# Patient Record
Sex: Male | Born: 1955 | Race: Black or African American | Hispanic: No | Marital: Married | Smoking: Current every day smoker
Health system: Southern US, Community
[De-identification: ages and names within clinical notes are randomized; demographics above are authoritative.]

## PROBLEM LIST (undated history)

## (undated) HISTORY — PX: HERNIA REPAIR: SHX51

---

## 2013-01-23 ENCOUNTER — Emergency Department: Payer: Self-pay | Admitting: Emergency Medicine
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
## 2018-03-20 ENCOUNTER — Other Ambulatory Visit: Payer: Self-pay

## 2018-03-20 ENCOUNTER — Encounter: Payer: Self-pay | Admitting: Emergency Medicine

## 2018-03-20 ENCOUNTER — Emergency Department: Payer: Self-pay

## 2018-03-20 ENCOUNTER — Emergency Department
Admission: EM | Admit: 2018-03-20 | Discharge: 2018-03-20 | Disposition: A | Discharge status: Home / Self Care | Payer: Self-pay | Attending: Emergency Medicine | Admitting: Emergency Medicine

## 2018-03-20 DIAGNOSIS — R42 Dizziness and giddiness: Secondary | ICD-10-CM | POA: Insufficient documentation

## 2018-03-20 DIAGNOSIS — I10 Essential (primary) hypertension: Secondary | ICD-10-CM | POA: Insufficient documentation

## 2018-03-20 DIAGNOSIS — F17210 Nicotine dependence, cigarettes, uncomplicated: Secondary | ICD-10-CM | POA: Insufficient documentation

## 2018-03-20 LAB — CBC
HCT: 46.2 % (ref 39.0–52.0)
Hemoglobin: 14.7 g/dL (ref 13.0–17.0)
MCH: 29.1 pg (ref 26.0–34.0)
MCHC: 31.8 g/dL (ref 30.0–36.0)
MCV: 91.3 fL (ref 80.0–100.0)
NRBC: 0 % (ref 0.0–0.2)
Platelets: 204 10*3/uL (ref 150–400)
RBC: 5.06 MIL/uL (ref 4.22–5.81)
RDW: 13 % (ref 11.5–15.5)
WBC: 6.6 10*3/uL (ref 4.0–10.5)

## 2018-03-20 LAB — BASIC METABOLIC PANEL
Anion gap: 7 (ref 5–15)
BUN: 11 mg/dL (ref 8–23)
CO2: 26 mmol/L (ref 22–32)
Calcium: 9.4 mg/dL (ref 8.9–10.3)
Chloride: 105 mmol/L (ref 98–111)
Creatinine, Ser: 0.67 mg/dL (ref 0.61–1.24)
GFR calc non Af Amer: >60 mL/min (ref >60)
Glucose, Bld: 97 mg/dL (ref 70–99)
Potassium: 4.2 mmol/L (ref 3.5–5.1)
Sodium: 138 mmol/L (ref 135–145)

## 2018-03-20 LAB — GLUCOSE, CAPILLARY: Glucose-Capillary: 77 mg/dL (ref 70–99)

## 2018-03-20 LAB — TROPONIN I: Troponin I: <0.03 ng/mL

## 2018-03-20 MED ORDER — HYDROCHLOROTHIAZIDE 25 MG PO TABS: 25.0000 mg | ORAL_TABLET | Freq: Every day | ORAL | 1 refills | Status: AC

## 2018-03-20 MED ORDER — HYDROCHLOROTHIAZIDE 25 MG PO TABS
25.0000 mg | ORAL_TABLET | Freq: Every day | ORAL | Status: DC
  Administered 2018-03-20: 25 mg via ORAL
  Filled 2018-03-20: qty 1

## 2018-03-20 NOTE — ED Provider Notes (Addendum)
[facility] Emergency Department Provider Note  ____________________________________________  Time seen: Approximately 3:40 PM  I have reviewed the triage vital signs and the nursing notes.   HISTORY  Chief Complaint Dizziness   HPI Shawn Heidelbergllen Tuff Jr. is a 62 y.o. male with history of smoking who presents for evaluation of lightheadedness.  Patient reports intermittent episodes of lightheadedness for the last 5 days.  He denies vertigo or room spinning sensation.  He reports that he feels like he is going to pass out.  The symptoms usually happen when he stands up and walks and resolves when he sits down.  He denies headache, slurred speech, facial droop, unilateral weakness or numbness, changes in vision, chest pain, shortness of breath, abdominal pain, nausea, vomiting.  Patient has not seen a doctor for an annual checkup in several years.  He was told 6 years ago after visit to the ER that he had high blood pressure but he never followed up for it.  He denies ever being on any medications for it.  He denies personal or family history of ischemic heart disease or stroke.  PMH None - reviewed  Past Surgical History:  Procedure Laterality Date  . HERNIA REPAIR      Prior to Admission medications   Medication Sig Start Date End Date Taking? Authorizing Provider  hydrochlorothiazide (HYDRODIURIL) 25 MG tablet Take 1 tablet (25 mg total) by mouth daily. 03/20/18   Nita SickleVeronese, [place], MD    Allergies Patient has no known allergies.  FH No history of stroke or heart disease.  Social History Social History   Tobacco Use  . Smoking status: Current Every Day Smoker    Packs/day: 0.50    Types: Cigarettes  . Smokeless tobacco: Never Used  Substance Use Topics  . Alcohol use: Yes    Comment: weekends  . Drug use: Never    Review of Systems  Constitutional: Negative for fever. + lightheadedness Eyes: Negative for visual changes. ENT: Negative for  sore throat. Neck: No neck pain  Cardiovascular: Negative for chest pain. Respiratory: Negative for shortness of breath. Gastrointestinal: Negative for abdominal pain, vomiting or diarrhea. Genitourinary: Negative for dysuria. Musculoskeletal: Negative for back pain. Skin: Negative for rash. Neurological: Negative for headaches, weakness or numbness. Psych: No SI or HI  ____________________________________________   PHYSICAL EXAM:  VITAL SIGNS: ED Triage Vitals  Enc Vitals Group     BP 03/20/18 1219 (!) 201/79     Pulse Rate 03/20/18 1219 73     Resp 03/20/18 1219 16     Temp 03/20/18 1219 98.3 F (36.8 C)     Temp Source 03/20/18 1219 Oral     SpO2 03/20/18 1219 100 %     Weight 03/20/18 1220 145 lb (65.8 kg)     Height 03/20/18 1220 5\' 9"  (1.753 m)     Head Circumference --      Peak Flow --      Pain Score 03/20/18 1220 0     Pain Loc --      Pain Edu? --      Excl. in GC? --     Constitutional: Alert and oriented. Well appearing and in no apparent distress. HEENT:      Head: Normocephalic and atraumatic.         Eyes: Conjunctivae are normal. Sclera is non-icteric.       Mouth/Throat: Mucous membranes are moist.       Neck: Supple with no signs of meningismus.  Cardiovascular: Regular rate and rhythm. No murmurs, gallops, or rubs. 2+ symmetrical distal pulses are present in all extremities. No JVD. Respiratory: Normal respiratory effort. Lungs are clear to auscultation bilaterally. No wheezes, crackles, or rhonchi.  Gastrointestinal: Soft, non tender, and non distended with positive bowel sounds. No rebound or guarding. Musculoskeletal: Nontender with normal range of motion in all extremities. No edema, cyanosis, or erythema of extremities. Neurologic: Normal speech and language. A & O x3, PERRL, EOMI, no nystagmus, CN II-XII intact, motor testing reveals good tone and bulk throughout. There is no evidence of pronator drift or dysmetria. Muscle strength is 5/5  throughout.  Sensory examination is intact. Gait is normal. Skin: Skin is warm, dry and intact. No rash noted. Psychiatric: Mood and affect are normal. Speech and behavior are normal.  ____________________________________________   LABS (all labs ordered are listed, but only abnormal results are displayed)  Labs Reviewed  BASIC METABOLIC PANEL  CBC  TROPONIN I  GLUCOSE, CAPILLARY  URINALYSIS, COMPLETE (UACMP) WITH MICROSCOPIC  CBG MONITORING, ED   ____________________________________________  EKG   ED ECG REPORT I, Nita Sicklearolina Sharran Caratachea, the attending physician, personally viewed and interpreted this ECG.  Normal sinus rhythm, rate of 69, normal intervals, LAFB, left axis deviation, no ST elevations or depressions.  Unchanged from prior. ____________________________________________  RADIOLOGY  I have personally reviewed the images performed during this visit and I agree with the Radiologist's read.   Interpretation by Radiologist:  Ct Head Wo Contrast  Result Date: 03/20/2018 CLINICAL DATA:  Dizziness. EXAM: CT HEAD WITHOUT CONTRAST TECHNIQUE: Contiguous axial images were obtained from the base of the skull through the vertex without intravenous contrast. COMPARISON:  None. FINDINGS: Brain: No evidence of acute infarction, hemorrhage, hydrocephalus, extra-axial collection or mass lesion/mass effect. Vascular: No hyperdense vessel or unexpected calcification. Skull: Normal. Negative for fracture or focal lesion. Sinuses/Orbits: No acute finding. Other: None. IMPRESSION: Normal head CT. Electronically Signed   By: Lupita RaiderJames  Green Jr, M.D.   On: 03/20/2018 15:55     ____________________________________________   PROCEDURES  Procedure(s) performed: None Procedures Critical Care performed:  None ____________________________________________   INITIAL IMPRESSION / ASSESSMENT AND PLAN / ED COURSE  62 y.o. male with history of smoking who presents for evaluation of  lightheadedness/ dizziness mostly with postural changes.  Patient extremely hypertensive in the emergency room but completely neurologically intact.  Denies vertigo-like symptoms.  Head CT negative for any evidence of stroke or hemorrhage.  Labs showing normal kidney function, normal CBC and electrolytes. No signs of dehydration, anemia, AKI.  EKG with no evidence of dysrhythmias or ischemia. Troponin negative. Since symptoms are ongoing for several days, no need to repeat troponin. No signs of ACS at this time. Orthostatic vital signs negative.  Blood pressure trending down with hydrochlorothiazide.  Most likely symptoms due to elevated blood pressure.  We will send patient home on HCTZ and follow-up with primary care doctor.    _________________________ 5:20 PM on 03/20/2018 -----------------------------------------  Patient reports resolution of his symptoms. Remains extremely well appearing. Will dc home on HCTZ, BP diary, dash diet, and f/u with PCP.  Discussed and return precautions with patient and his wife   As part of my medical decision making, I reviewed the following data within the electronic MEDICAL RECORD NUMBER Nursing notes reviewed and incorporated, Labs reviewed , EKG interpreted , Old EKG reviewed, Old chart reviewed, Radiograph reviewed , Notes from prior ED visits and [place] Controlled Substance Database    Pertinent labs & imaging results  that were available during my care of the patient were reviewed by me and considered in my medical decision making (see chart for details).    ____________________________________________   FINAL CLINICAL IMPRESSION(S) / ED DIAGNOSES  Final diagnoses:  Lightheadedness  Hypertension, unspecified type      NEW MEDICATIONS STARTED DURING THIS VISIT:  ED Discharge Orders         Ordered    hydrochlorothiazide (HYDRODIURIL) 25 MG tablet  Daily     03/20/18 1638           Note:  This document was prepared using Dragon voice  recognition software and may include unintentional dictation errors.    Don Perking, [place], MD 03/20/18 1638    Nita Sickle, MD 03/20/18 [phone]

## 2018-03-20 NOTE — ED Triage Notes (Signed)
Pt to ED from home c/o dizziness and general weakness and off balance since this last Sunday and not getting better.  Denies new pain, has chronic neck pain, denies n/v/d.  States dizziness gets worse after changing positions.  States eating and drinking like normal.  Pt chest rise even and unlabored, skin warm and dry and in NAD at this time.

## 2018-03-20 NOTE — ED Notes (Signed)
Patient verbalized understanding of discharge instructions, no questions. Patient ambulated out of ED with steady gait in no distress.  

## 2020-01-08 IMAGING — CT CT HEAD W/O CM
3 series · 16 of 47 positions shown, 19 images · non-contrast
Comparison: None.

CLINICAL DATA: Dizziness.

EXAM:
CT HEAD WITHOUT CONTRAST
TECHNIQUE: Contiguous axial images were obtained from the base of the skull
through the vertex without intravenous contrast.

[Series 2: head wo · axial · 0.42mm/px · z∈[-89,+36]mm · 10 of 30 slices shown, 13 images]
[im 3/30  brain]
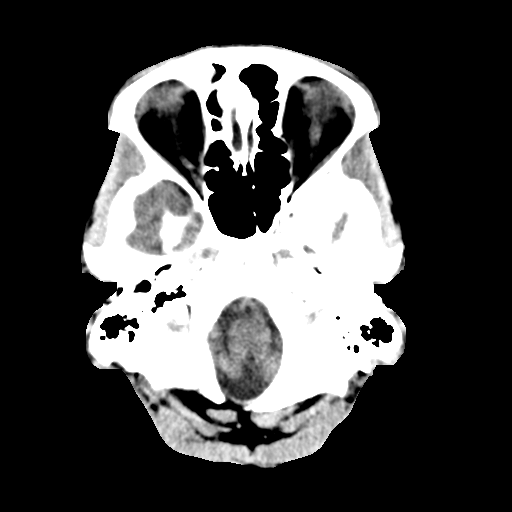
[im 3/30  bone]
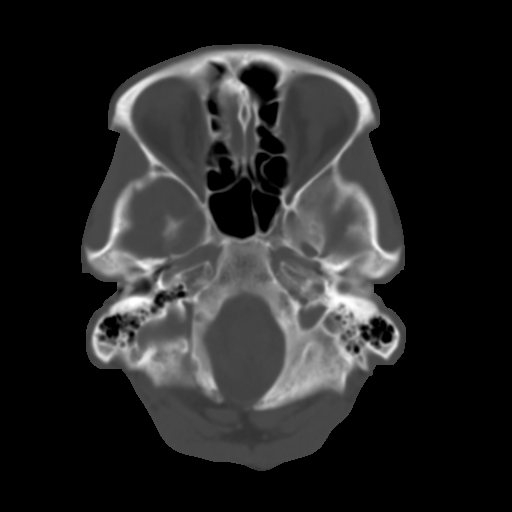
[im 6/30  brain]
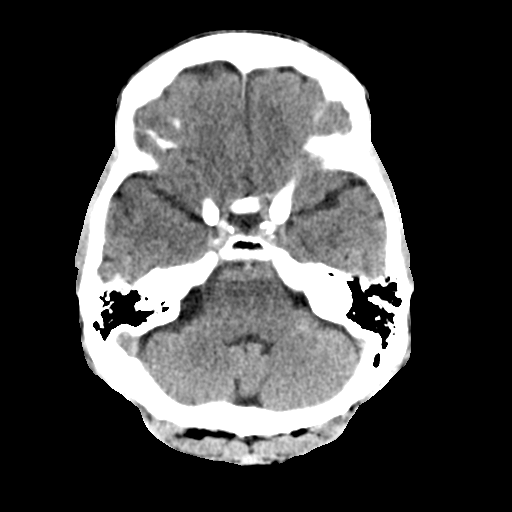
[im 9/30  brain]
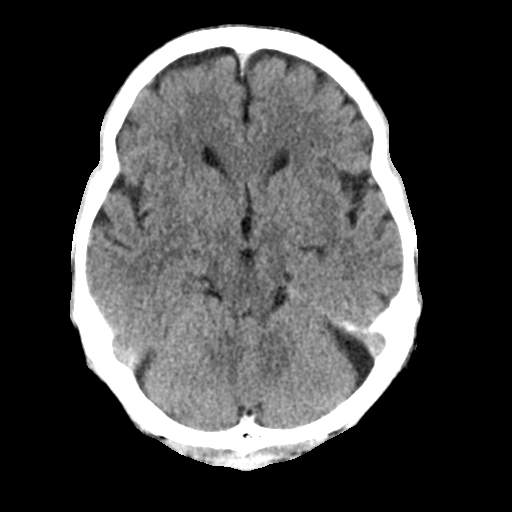
[im 11/30  brain]
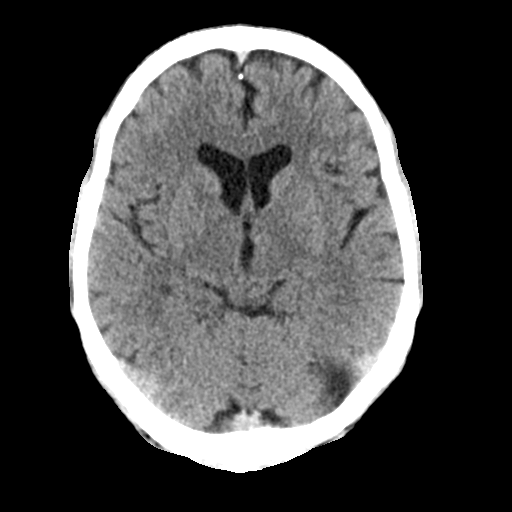
[im 14/30  brain]
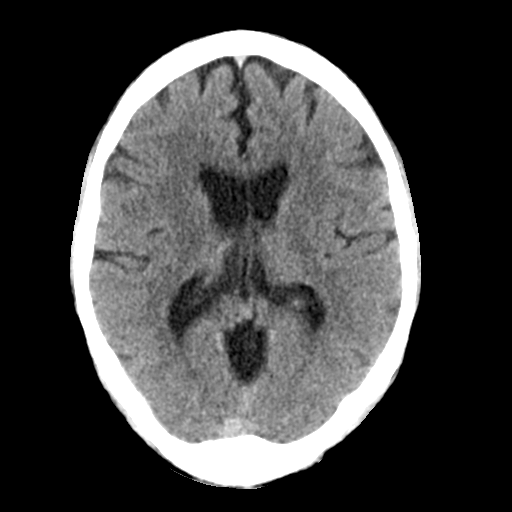
[im 14/30  bone]
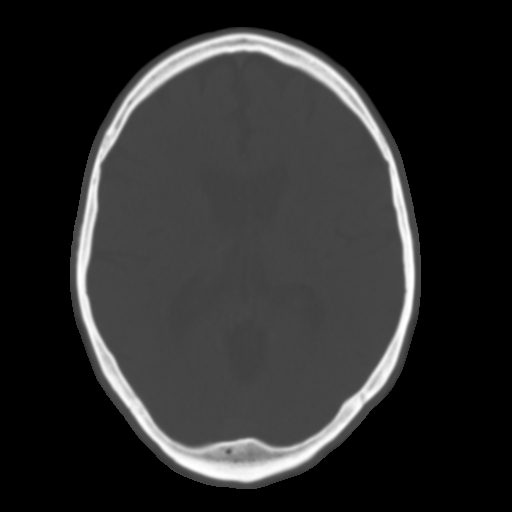
[im 17/30  brain]
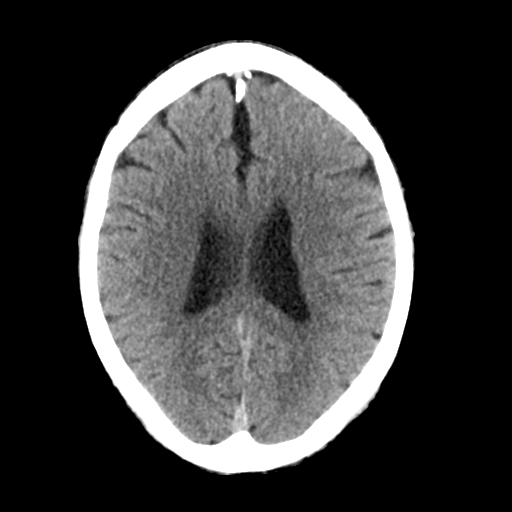
[im 20/30  brain]
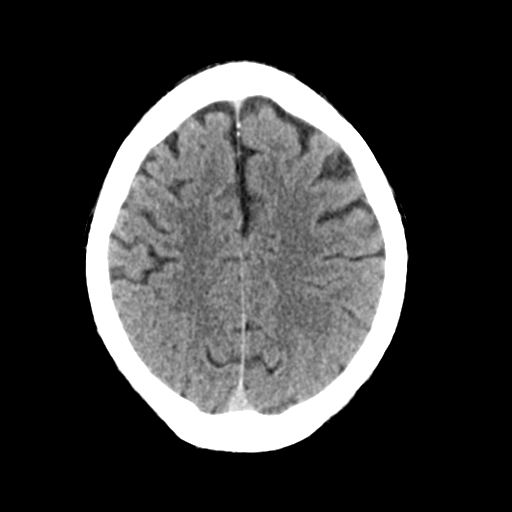
[im 23/30  brain]
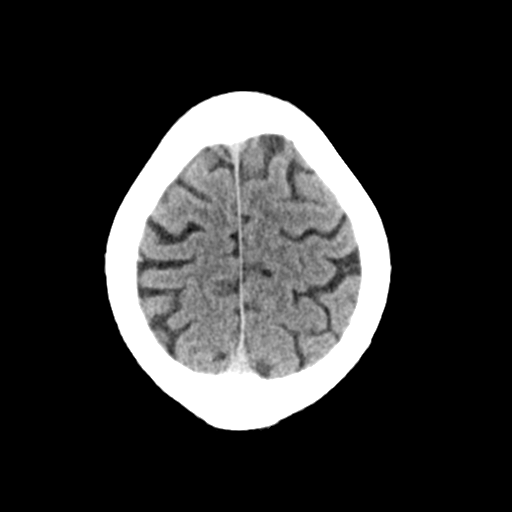
[im 25/30  brain]
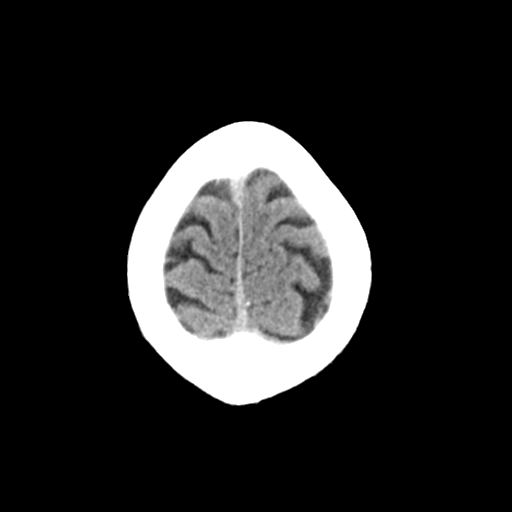
[im 25/30  bone]
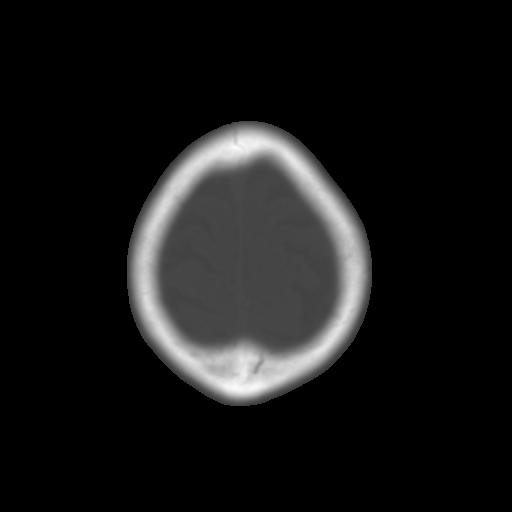
[im 28/30  brain]
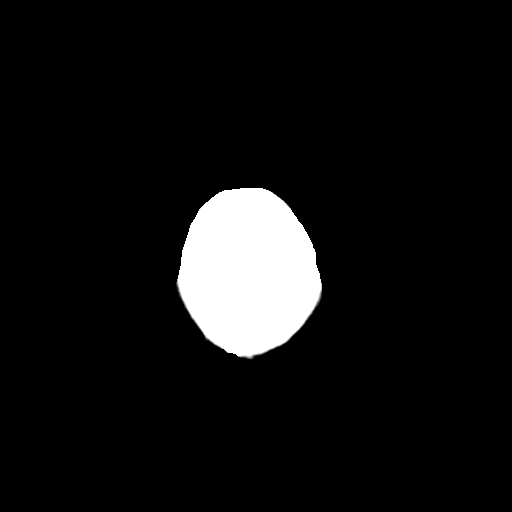

[Series 4: coronal soft tissue · coronal · 0.32mm/px · 3 of 65 slices shown]
[im 22/65  brain]
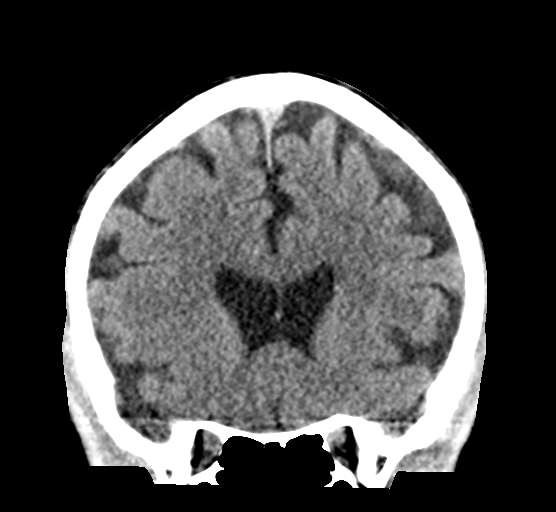
[im 29/65  brain]
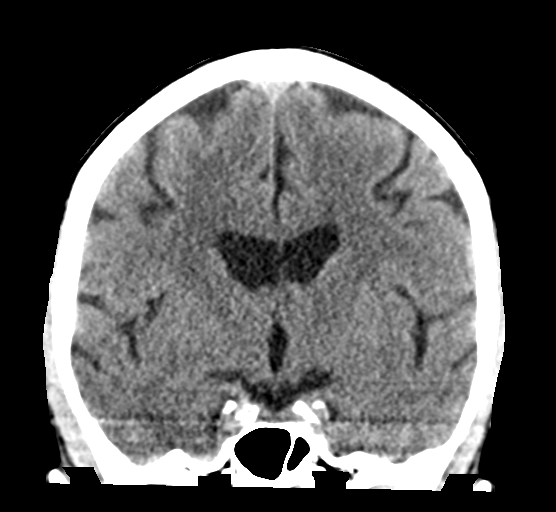
[im 36/65  brain]
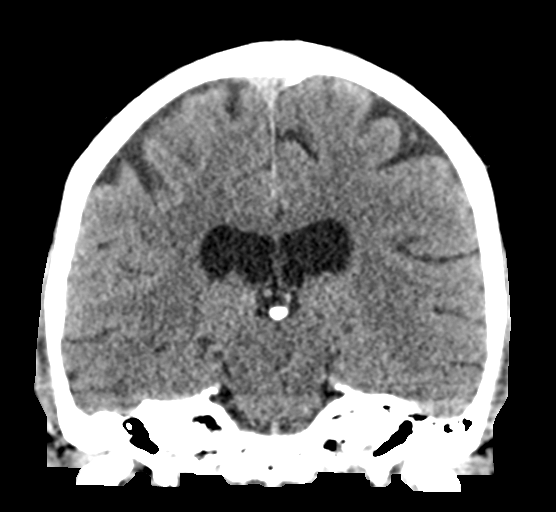

[Series 5: sagittal soft tissue · sagittal · 0.30mm/px · 3 of 53 slices shown]
[im 18/53  brain]
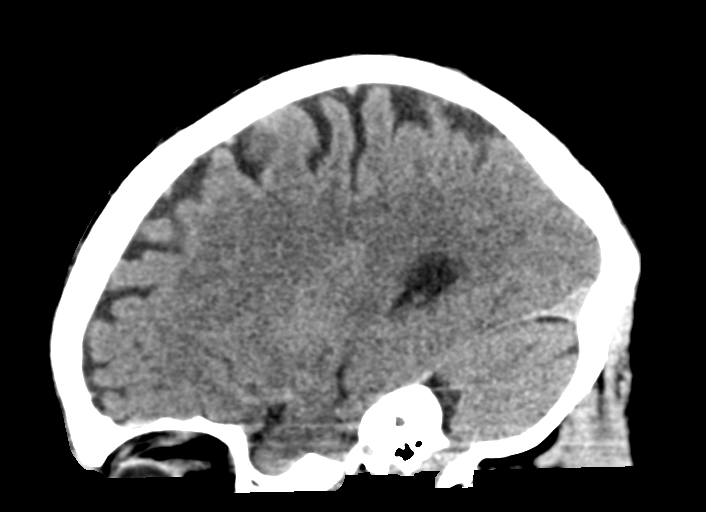
[im 27/53  brain]
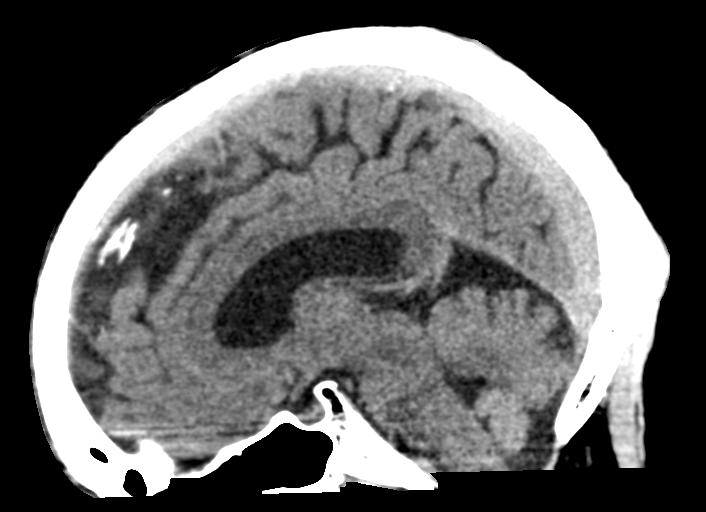
[im 35/53  brain]
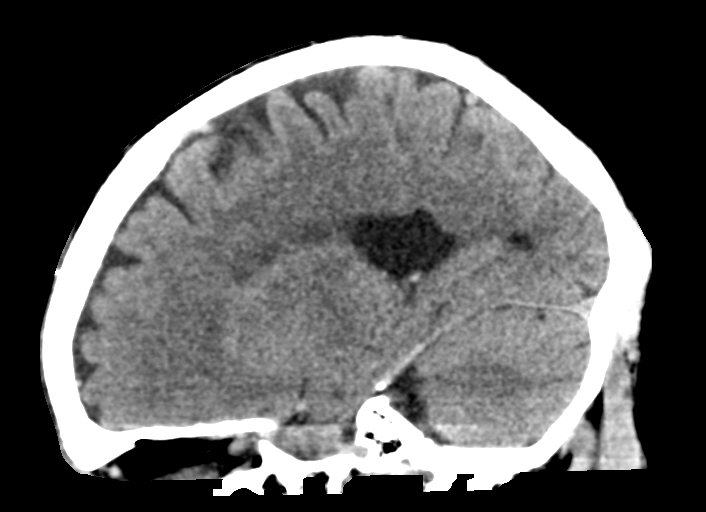

[16 of 47 positions shown; findings below may reference images not displayed]

FINDINGS: Brain: No evidence of acute infarction, hemorrhage, hydrocephalus,
extra-axial collection or mass lesion/mass effect.

Vascular: No hyperdense vessel or unexpected calcification.

Skull: Normal. Negative for fracture or focal lesion.

Sinuses/Orbits: No acute finding.

Other: None.
IMPRESSION: Normal head CT.

## 2021-08-24 DIAGNOSIS — B1920 Unspecified viral hepatitis C without hepatic coma: Secondary | ICD-10-CM | POA: Diagnosis not present

## 2021-08-24 DIAGNOSIS — Z1389 Encounter for screening for other disorder: Secondary | ICD-10-CM | POA: Diagnosis not present

## 2021-09-21 DIAGNOSIS — Z1389 Encounter for screening for other disorder: Secondary | ICD-10-CM | POA: Diagnosis not present

## 2021-09-21 DIAGNOSIS — B1920 Unspecified viral hepatitis C without hepatic coma: Secondary | ICD-10-CM | POA: Diagnosis not present

## 2021-10-01 DIAGNOSIS — Z719 Counseling, unspecified: Secondary | ICD-10-CM | POA: Diagnosis not present

## 2021-10-01 DIAGNOSIS — Z1389 Encounter for screening for other disorder: Secondary | ICD-10-CM | POA: Diagnosis not present

## 2021-10-23 DIAGNOSIS — Z719 Counseling, unspecified: Secondary | ICD-10-CM | POA: Diagnosis not present

## 2021-10-23 DIAGNOSIS — B1920 Unspecified viral hepatitis C without hepatic coma: Secondary | ICD-10-CM | POA: Diagnosis not present

## 2021-10-23 DIAGNOSIS — Z1389 Encounter for screening for other disorder: Secondary | ICD-10-CM | POA: Diagnosis not present
